# Patient Record
Sex: Male | Born: 1950 | Race: White | Hispanic: No | Marital: Married | Smoking: Never smoker
Health system: Southern US, Community
[De-identification: ages and names within clinical notes are randomized; demographics above are authoritative.]

## PROBLEM LIST (undated history)

## (undated) DIAGNOSIS — I10 Essential (primary) hypertension: Secondary | ICD-10-CM

---

## 1997-07-24 ENCOUNTER — Ambulatory Visit (HOSPITAL_COMMUNITY): Admission: RE | Admit: 1997-07-24 | Discharge: 1997-07-24 | Payer: Self-pay | Admitting: Gastroenterology

## 1999-05-12 ENCOUNTER — Ambulatory Visit (HOSPITAL_COMMUNITY): Admission: RE | Admit: 1999-05-12 | Discharge: 1999-05-12 | Payer: Self-pay | Admitting: Gastroenterology

## 1999-05-26 ENCOUNTER — Encounter: Payer: Self-pay | Admitting: Gastroenterology

## 1999-05-26 ENCOUNTER — Ambulatory Visit (HOSPITAL_COMMUNITY): Admission: RE | Admit: 1999-05-26 | Discharge: 1999-05-26 | Payer: Self-pay | Admitting: Gastroenterology

## 2003-12-26 ENCOUNTER — Ambulatory Visit: Payer: Self-pay | Admitting: Family Medicine

## 2004-02-26 ENCOUNTER — Encounter: Admission: RE | Admit: 2004-02-26 | Discharge: 2004-02-26 | Payer: Self-pay | Admitting: Occupational Medicine

## 2009-06-24 ENCOUNTER — Ambulatory Visit: Payer: Self-pay | Admitting: Emergency Medicine

## 2009-06-24 DIAGNOSIS — H6120 Impacted cerumen, unspecified ear: Secondary | ICD-10-CM

## 2010-02-04 NOTE — Assessment & Plan Note (Signed)
Summary: EAR WAX FLUSHED PROBLEMS WITH HEARING BOTH EARS   Vital Signs:  Patient Profile:   60 Years Old Male CC:      Cerumen impaction Height:     73 inches Weight:      175 pounds O2 Sat:      98 % O2 treatment:    Room Air Temp:     98.7 degrees F oral Pulse rate:   69 / minute Pulse rhythm:   regular Resp:     12 per minute BP sitting:   146 / 88  (right arm) Cuff size:   large  Vitals Entered By: Emilio Math (June 24, 2009 2:59 PM)                  Current Allergies: No known allergies History of Present Illness Chief Complaint: Cerumen impaction History of Present Illness: Went to get fitted for hearing aids and told he had too much wax.  He is here for irrigation both ears.  Some hearing loss, no ringing, no dizziness, no fever, no HA. Constant.  No OTC's help.  REVIEW OF SYSTEMS Constitutional Symptoms      Denies fever, chills, night sweats, weight loss, weight gain, and fatigue.  Eyes       Denies change in vision, eye pain, eye discharge, glasses, contact lenses, and eye surgery. Ear/Nose/Throat/Mouth       Complains of change in hearing.      Denies hearing loss/aids, ear pain, ear discharge, dizziness, frequent runny nose, frequent nose bleeds, sinus problems, sore throat, hoarseness, and tooth pain or bleeding.  Respiratory       Denies dry cough, productive cough, wheezing, shortness of breath, asthma, bronchitis, and emphysema/COPD.  Cardiovascular       Denies murmurs, chest pain, and tires easily with exhertion.    Gastrointestinal       Denies stomach pain, nausea/vomiting, diarrhea, constipation, blood in bowel movements, and indigestion. Genitourniary       Denies painful urination, kidney stones, and loss of urinary control. Neurological       Denies paralysis, seizures, and fainting/blackouts. Musculoskeletal       Denies muscle pain, joint pain, joint stiffness, decreased range of motion, redness, swelling, muscle weakness, and gout.   Skin       Denies bruising, unusual mles/lumps or sores, and hair/skin or nail changes.  Psych       Denies mood changes, temper/anger issues, anxiety/stress, speech problems, depression, and sleep problems.  Past History:  Past Medical History: Unremarkable  Past Surgical History: Denies surgical history  Family History: Mother, Healthy Father, D, Old age  Social History: Non smoker ETOH-yes No Drugs [place] Physical Exam General appearance: well developed, well nourished, no acute distress Head: normocephalic, atraumatic Ears: bilateral impacted cerumen.  After flushing, ears mostly clear.  Some canal erythema and swelling bilateral. but no infection. Nasal: mucosa pink, nonedematous, no septal deviation, turbinates normal Oral/Pharynx: tongue normal, posterior pharynx without erythema or exudate Chest/Lungs: no rales, wheezes, or rhonchi bilateral, breath sounds equal without effort Heart: regular rate and  rhythm, no murmur Assessment New Problems: CERUMEN IMPACTION, BILATERAL (ICD-380.4)   Plan New Medications/Changes: CIPRODEX 0.3-0.1 % SUSP (CIPROFLOXACIN-DEXAMETHASONE) 4 drops both ears two times a day for 7 days  #1 bottle x 0, 06/24/2009, Hoyt Koch MD  New Orders: New Patient Level III [phone] Cerumen Impaction Removal [69210]  The patient and/or caregiver has been counseled thoroughly with regard to medications prescribed including dosage, schedule, interactions, rationale  for use, and possible side effects and they verbalize understanding.  Diagnoses and expected course of recovery discussed and will return if not improved as expected or if the condition worsens. Patient and/or caregiver verbalized understanding.  Prescriptions: CIPRODEX 0.3-0.1 % SUSP (CIPROFLOXACIN-DEXAMETHASONE) 4 drops both ears two times a day for 7 days  #1 bottle x 0   Entered and Authorized by:   Hoyt Koch MD   Signed by:   Hoyt Koch MD on 06/24/2009    Method used:   Print then Give to Patient   RxID:   [id number]   Patient Instructions: 1)  Keep ears clean and dry. 2)  Return to audiologist to be retested to see if you still need hearing aids now that the wax has been removed 3)  Use eardrops for any pain/irritation  Orders Added: 1)  New Patient Level III [14782] 2)  Cerumen Impaction Removal [95621]

## 2010-05-23 NOTE — Op Note (Signed)
[facility]  Patient:    Alexander Butler, Alexander Butler                      MRN: [id number] Adm. Date:  [id number] Disc. Date: [id number] Attending:  Deneen Harts CC:         Leroy Sea., M.D.                           Operative Report  PROCEDURE:  Panendoscopy; foreign body removal.  ENDOSCOPIST:  Griffith Citron, M.D.  INDICATION:  [age]-year-old white male presenting with acute meat impaction since 7 p.m. last night.  Prior history of dysphagia with endoscopy revealing distal esophageal rings. These were dilated, July 1999, to 52-French using [facility] dilators.  Patient has not been maintained on any acid-suppressing medication.  Undergoing endoscopy to relieve meat impaction and to reassess distal esophageal narrowing.  DESCRIPTION OF PROCEDURE:  After reviewing the nature of the procedure with the patient including the potential risks of hemorrhage and especially perforation, informed consent was signed.  Patient was premedicated, receiving IV sedation totalling Versed 10 mg and Fentanyl 75 mcg, administered IV in divided doses prior to and during the course of the procedure.  Using an Olympus videoendoscope, proximal esophagus intubated under direct vision; the oropharynx normal.  No lesion of the epiglottis, vocal cords or piriform sinus.  Upper esophageal sphincter easily traversed.  Proximal and midesophagus normal except for some retained secretions, which were aspirated with the scope.  A large meat bolus was located in the distal esophagus.  This was grasped with the endoscopic snare and retracted along with the scope.  Following this, the patient was reintubated with the endoscope.  In the distal esophagus at approximately 36 cm, patient has a concentric distal esophageal ring with narrowing.  The mucosa was ulcerated and inflamed but appears benign.  The bottom 3 to 4 cm of the esophagus appeared normal with a widely patent GE  junction.  Mucosal Z-line is distinct.  No evidence of neoplasia.  Gastric fundus, body and antrum were normal except for scattered coffee grounds, without mucosal inflammation being noted.  Pylorus symmetric. Duodenal bulb and second portion normal.  Retroflexed view of the angularis, lesser curve, gastric cardia and fundus negative.  Stomach was decompressed; scope withdrawn.  Patient tolerated the procedure without difficulty, being maintained on digiscope monitor and low-flow oxygen throughout.  Returned to recovery in stable condition.  ASSESSMENT: 1. Meat impaction -- successfully relieved endoscopically. 2. 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  RECOMMENDATION: 1. Nexium 40 mg daily. 2. Endoscopy with Savary dilation in one to two weeks. DD:  05/12/99 TD:  05/14/99 Job: [id number]

## 2010-05-23 NOTE — Procedures (Signed)
[facility]  Patient:    Alexander Butler, Alexander Butler                      MRN: [id number] Proc. Date: 05/26/99 Adm. Date:  [id number] Disc. Date: [id number] Attending:  Deneen Harts CC:         Leroy Sea., M.D.                           Procedure Report  PROCEDURE PERFORMED:  Panendoscopy with Savary dilation.  ENDOSCOPIST:  Griffith Citron, M.D.  INDICATIONS FOR PROCEDURE:  A 60 year old white male presenting two weeks ago with food impaction undergoing endoscopy following disimpaction and resolution of acute esophagitis related to that event.  The patient has remained asymptomatic during the interim.  He could not tolerate Nexium due to dizziness.  Denies pyrosis, ____________ .  DESCRIPTION OF PROCEDURE:  After reviewing the nature of the procedure with the patient including potential risks and complications, and after discussing alternative methods of diagnosis and treatment, informed consent was signed.  The patient was premedicated receiving topical anesthetic followed by IV sedation totaling Versed 10 mg, fentanyl 100 mcg administered in divided doses prior to and during the course of the procedure.  Endoscopy using the Olympus video endoscope was successfully completed.  The oropharynx was normal.  No lesion of epiglottis, vocal cords or piriform sinus.  The proximal esophagus was easily intubated and the scope advanced revealing a normal proximal, mid and distal segment.  At the esophagogastric junction a striction was noted at approximately 40 cm.  The mucosa was not inflammed.  No nodularity, erosion, ulceration.  No erythema, no edema.  A large hiatal hernia from 40 to 45 cm detected, not inflamed.  The gastric fundus and body were normal, antrum remarkable for erythematous mucosa, no erosion or ulceration.  Slight coffee ground exudate adherent to the wall.  Pylorus was symmetric, easily traversed.  The duodenal bulb and second  portion were both normal.  Retroflex view of the angularis, lesser curve, gastric cardia and fundus revealed no additional finding.  Under endoscopic control, a Savary guide wire was laid along the greater curve.  Position was maintained with the fluoroscopy while the scope was removed.  Over the orad portion of the guide wire 15 then 16 mm diameter Savary dilators were passed under fluoroscopic guidance.  No resistance appreciated with a 15 mm diameter, no heme staining.  With passage of a 16 mm diameter dilator, there was minimal resistance but obvious heme staining upon withdrawal of the dilator.  Guide wire was subsequently removed.  Re-endoscopy was performed because of the heme and this revealed a large mucosal rent in the distal esophagus at the level of the stricture.  No other abnormality was noted.  The esophagus was decompressed and scope withdrawn.  The patient tolerated the procedure without difficulty being maintained on Datascope monitor and low-flow oxygen throughout.  Returned to recovery in stable condition.  ASSESSMENT: 1. Distal esophagogastric junction stricture. 2. Savary dilation to 16 mm.  Terminated at this point due to mucosal rent    with heme.  Large hiatal hernia not inflamed.  Antritis--mild.  RECOMMENDATIONS: 1. Gastrografin barium swallow prior to discharge. 2. Retry Nexium 40 mg daily.  If not tolerated, switch to alternative PPI    therapy. 3. Repeat esophageal dilation p.r.n. DD:  05/26/99 TD:  05/30/99 Job: [id number]

## 2013-08-31 ENCOUNTER — Emergency Department (INDEPENDENT_AMBULATORY_CARE_PROVIDER_SITE_OTHER): Payer: BC Managed Care – PPO

## 2013-08-31 ENCOUNTER — Emergency Department
Admission: EM | Admit: 2013-08-31 | Discharge: 2013-08-31 | Disposition: A | Discharge status: Home / Self Care | Payer: BC Managed Care – PPO | Source: Home / Self Care | Attending: Emergency Medicine | Admitting: Emergency Medicine

## 2013-08-31 ENCOUNTER — Encounter: Payer: Self-pay | Admitting: Emergency Medicine

## 2013-08-31 DIAGNOSIS — X58XXXA Exposure to other specified factors, initial encounter: Secondary | ICD-10-CM

## 2013-08-31 DIAGNOSIS — S62309A Unspecified fracture of unspecified metacarpal bone, initial encounter for closed fracture: Secondary | ICD-10-CM

## 2013-08-31 DIAGNOSIS — S62351A Nondisplaced fracture of shaft of second metacarpal bone, left hand, initial encounter for closed fracture: Secondary | ICD-10-CM

## 2013-08-31 DIAGNOSIS — S62329A Displaced fracture of shaft of unspecified metacarpal bone, initial encounter for closed fracture: Secondary | ICD-10-CM

## 2013-08-31 HISTORY — DX: Essential (primary) hypertension: I10

## 2013-08-31 MED ORDER — HYDROCODONE-ACETAMINOPHEN 5-325 MG PO TABS: 1.0000 | ORAL_TABLET | ORAL | Status: DC | PRN

## 2013-08-31 NOTE — Discharge Instructions (Signed)
Splint Care Casts and splints support injured limbs and keep bones from moving while they heal.  HOME CARE  Keep the cast or splint uncovered during the drying period.  .  A fiberglass cast will dry in less than 1 hour.  Do not rest the cast on anything harder than a pillow for 24 hours.  Do not put weight on your injured limb. Do not put pressure on the cast. Wait for your doctor's approval.  Keep the cast or splint dry.  Cover the cast or splint with a plastic bag during baths or wet weather.  If you have a cast over your chest and belly (trunk), take sponge baths until the cast is taken off.  If your cast gets wet, dry it with a towel or blow dryer. Use the cool setting on the blow dryer.  Keep your cast or splint clean. Wash a dirty cast with a damp cloth.  Do not put any objects under your cast or splint.  Do not scratch the skin under the cast with an object. If itching is a problem, use a blow dryer on a cool setting over the itchy area.  Do not trim or cut your cast.  Do not take out the padding from inside your cast.  Exercise your joints near the cast as told by your doctor.  Raise (elevate) your injured limb on 1 or 2 pillows for the first 1 to 3 days. GET HELP IF:  Your cast or splint cracks.  Your cast or splint is too tight or too loose.  You itch badly under the cast.  Your cast gets wet or has a soft spot.  You have a bad smell coming from the cast.  You get an object stuck under the cast.  Your skin around the cast becomes red or sore.  You have new or more pain after the cast is put on. GET HELP RIGHT AWAY IF:  You have fluid leaking through the cast.  You cannot move your fingers or toes.  Your fingers or toes turn blue or white or are cool, painful, or puffy (swollen).  You have tingling or lose feeling (numbness) around the injured area.  You have bad pain or pressure under the cast.  You have trouble breathing or have shortness of  breath.  You have chest pain.  Followup appointment with Dr. Benjamin Stain, sports medicine specialist on Monday 8/31 930 am

## 2013-08-31 NOTE — ED Provider Notes (Addendum)
CSN: [id number]     Arrival date & time 08/31/13  1451 History   First MD Initiated Contact with Patient 08/31/13 1455     Chief Complaint  Patient presents with  . Hand Injury  Left hand injury yesterday, while loading an old satellite dish dropped it on his hand. Progressively swollen and painful. Patient is a 63 y.o. male presenting with hand injury. The history is provided by the patient.  Hand Injury Time since incident:  1 day Injury: yes   Pain details:    Quality:  Aching   Radiates to:  Does not radiate   Pain severity now: 6/10.   Progression:  Worsening Chronicity:  New Handedness:  Right-handed Relieved by:  Rest Exacerbated by: Activity such as gripping. He did yard work after the injury and that exacerbated the right hand pain. Associated symptoms: decreased range of motion and swelling   Associated symptoms: no back pain, no fever, no muscle weakness, no neck pain, no numbness and no tingling   Risk factors: no recent illness      Past Medical History  Diagnosis Date  . Hypertension    History reviewed. No pertinent past surgical history. No family history on file. History  Substance Use Topics  . Smoking status: Never Smoker   . Smokeless tobacco: Not on file  . Alcohol Use: Yes    Review of Systems  Constitutional: Negative for fever.  Musculoskeletal: Negative for back pain and neck pain.    Allergies  Review of patient's allergies indicates no known allergies.  Home Medications   Prior to Admission medications   Medication Sig Start Date End Date Taking? Authorizing Provider  lisinopril (PRINIVIL,ZESTRIL) 20 MG tablet Take 20 mg by mouth daily.   Yes Historical Provider, MD  omeprazole (PRILOSEC) 20 MG capsule Take 20 mg by mouth daily.   Yes Historical Provider, MD  HYDROcodone-acetaminophen (NORCO/VICODIN) 5-325 MG per tablet Take 1-2 tablets by mouth every 4 (four) hours as needed for severe pain. Take with food. 08/31/13   Lajean Manes, MD    BP 127/82  Pulse 68  Temp(Src) 98.4 F (36.9 C) (Oral)  Ht  (1.854 m)  Wt 182 lb (82.555 kg)  BMI 24.02 kg/m2  SpO2 99% Physical Exam  Nursing note and vitals reviewed. Constitutional: He is oriented to person, place, and time. He appears well-developed and well-nourished. No distress.  HENT:  Head: Normocephalic and atraumatic.  Eyes: Conjunctivae and EOM are normal. Pupils are equal, round, and reactive to light. No scleral icterus.  Neck: Normal range of motion.  Cardiovascular: Normal rate.   Pulmonary/Chest: Effort normal.  Abdominal: He exhibits no distension.  Musculoskeletal:       Right wrist: Normal. He exhibits normal range of motion, no tenderness and no swelling.       Left wrist: Normal. He exhibits normal range of motion, no tenderness, no bony tenderness and no swelling.       Right hand: Normal. He exhibits normal range of motion, no bony tenderness, normal capillary refill and no swelling. Normal sensation noted. Normal strength noted.       Left hand: He exhibits decreased range of motion, bony tenderness and swelling (3+). He exhibits normal capillary refill and no laceration. Normal sensation noted. Normal strength noted.       Hands: Left hand: He has chronic thickening of tendons/Dupuytren's contracture of  fourth and fifth metacarpal area, palmar aspect. 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  Otherwise, tendons and function of left hand within normal limits  Neurological: He is alert and oriented to person, place, and time.  Skin: Skin is warm.  Psychiatric: He has a normal mood and affect.    Neurovascular distally intact of left hand ED Course  Procedures (including critical care time) Labs Review Labs Reviewed - No data to display  Imaging Review Dg Hand Complete Left  08/31/2013   CLINICAL DATA:  Contusion of the left hand. With swelling and erythema over the metacarpal region.  EXAM: LEFT HAND - COMPLETE 3+ VIEW   COMPARISON:  Left hand dated February 26, 2004  FINDINGS: The bones are adequately mineralized. There is a nondisplaced fracture through the head of the second metacarpal. There is mild to moderate degenerative change of the second and third metacarpophalangeal joints which has progressed since the previous study. The interphalangeal joints exhibit no significant degenerative change. There is mild degenerative change of the first carpometacarpal joint.  IMPRESSION: 1. There is an acute nondisplaced fracture through the head of the second metacarpal. No fractures demonstrated elsewhere. 2. There are degenerative changes of the second and third metacarpophalangeal joints and of the first carpometacarpal joint.   Electronically Signed   By: David  [place]   On: 08/31/2013 15:31     MDM   1. Closed nondisplaced fracture of shaft of second metacarpal bone of left hand, initial encounter     I consulted Dr. Benjamin Stain (sports medicine specialist) who reviewed x-rays with me. He who advised Fiberglas splinting, and followup with Dr. Benjamin Stain in 4-5 days.  Treatment options discussed with patient, as well as risks, benefits, alternatives. Patient voiced understanding and agreement with the following plans:  I applied Left Fiberglass Volar splint , from proximal elbow to distal fingers, with thumb exposed. He tolerated well. Ibuprofen OTC 600 mg 3 times a day with food when necessary mild to moderate pain. Vicodin if needed for severe pain.-Precautions discussed Cast aftercare discussed.--An After Visit Summary was printed and given to the patient--this included detailed splint care and precautions . We made a followup appointment with Dr. Benjamin Stain for Monday 8/31 at 9:30 AM.  Precautions discussed. Red flags discussed.--- Emergency room if any red flags. Questions invited and answered. Patient voiced understanding and agreement.    Lajean Manes, MD 08/31/13 1634  Addendum: I wrote a  work note stating that he has a fracture of left hand. No work from 8/27 through 09/04/13. After 09/04/13, work status to be determined by the specialist.  Lajean Manes, MD 08/31/13 [phone]

## 2013-08-31 NOTE — ED Notes (Signed)
Left hand injury yesterday, while loading an old satellite dish dropped it on his hand. 6/10, red, swollen

## 2013-09-04 ENCOUNTER — Ambulatory Visit (INDEPENDENT_AMBULATORY_CARE_PROVIDER_SITE_OTHER): Payer: BC Managed Care – PPO

## 2013-09-04 ENCOUNTER — Encounter: Payer: Self-pay | Admitting: Sports Medicine

## 2013-09-04 ENCOUNTER — Ambulatory Visit (INDEPENDENT_AMBULATORY_CARE_PROVIDER_SITE_OTHER): Payer: BC Managed Care – PPO | Admitting: Sports Medicine

## 2013-09-04 VITALS — BP 143/89 | HR 72 | Ht 73.0 in | Wt 182.0 lb

## 2013-09-04 DIAGNOSIS — IMO0001 Reserved for inherently not codable concepts without codable children: Secondary | ICD-10-CM

## 2013-09-04 DIAGNOSIS — S62301A Unspecified fracture of second metacarpal bone, left hand, initial encounter for closed fracture: Secondary | ICD-10-CM | POA: Insufficient documentation

## 2013-09-04 DIAGNOSIS — S62309A Unspecified fracture of unspecified metacarpal bone, initial encounter for closed fracture: Secondary | ICD-10-CM

## 2013-09-04 NOTE — Progress Notes (Signed)
   Subjective:    I'm seeing this patient as a consultation for:  Dr. Georgina Pillion  CC: Left hand fracture  HPI: This is a very pleasant 63 year old male, 4 days ago he had a large satellite dish falling his head, he had immediate pain and swelling, x-rays at her daycare show a transverse fracture through the neck of the second metacarpal. He was placed in a volar splint and referred to me for further evaluation and definitive treatment. Pain control is adequate. Mild, persistent.  Past medical history, Surgical history, Family history not pertinant except as noted below, Social history, Allergies, and medications have been entered into the medical record, reviewed, and no changes needed.   Review of Systems: No headache, visual changes, nausea, vomiting, diarrhea, constipation, dizziness, abdominal pain, skin rash, fevers, chills, night sweats, weight loss, swollen lymph nodes, body aches, joint swelling, muscle aches, chest pain, shortness of breath, mood changes, visual or auditory hallucinations.   Objective:   General: Well Developed, well nourished, and in no acute distress.  Neuro/Psych: Alert and oriented x3, extra-ocular muscles intact, able to move all 4 extremities, sensation grossly intact. Skin: Warm and dry, no rashes noted.  Respiratory: Not using accessory muscles, speaking in full sentences, trachea midline.  Cardiovascular: Pulses palpable, no extremity edema. Abdomen: Does not appear distended. Left hand :Swollen, bruised, tender to palpation over the neck of the second metacarpal.  X-rays do show a transverse fracture, nondisplaced non-angulated through the neck of the second metacarpal.  Radial gutter splint placed.  Impression and Recommendations:   This case required medical decision making of moderate complexity.

## 2013-09-04 NOTE — Assessment & Plan Note (Signed)
4 days post fracture, it is through the neck of the second metatarsal. Nondisplaced, non-angulated. This will respond well to radial gutter splint immobilization until swelling is resolved and then I will place a radial gutter cast. X-rays today, splint placed. Return in one week, x-rays before visit.  I billed a fracture code for this encounter, all subsequent visits will be post-op checks in the global period.

## 2013-09-18 ENCOUNTER — Encounter: Payer: Self-pay | Admitting: Sports Medicine

## 2013-09-18 ENCOUNTER — Ambulatory Visit (INDEPENDENT_AMBULATORY_CARE_PROVIDER_SITE_OTHER): Payer: BC Managed Care – PPO

## 2013-09-18 ENCOUNTER — Ambulatory Visit (INDEPENDENT_AMBULATORY_CARE_PROVIDER_SITE_OTHER): Payer: BC Managed Care – PPO | Admitting: Sports Medicine

## 2013-09-18 VITALS — BP 144/86 | HR 66 | Ht 72.0 in | Wt 181.0 lb

## 2013-09-18 DIAGNOSIS — IMO0001 Reserved for inherently not codable concepts without codable children: Secondary | ICD-10-CM

## 2013-09-18 DIAGNOSIS — S62301D Unspecified fracture of second metacarpal bone, left hand, subsequent encounter for fracture with routine healing: Secondary | ICD-10-CM

## 2013-09-18 DIAGNOSIS — S62309D Unspecified fracture of unspecified metacarpal bone, subsequent encounter for fracture with routine healing: Secondary | ICD-10-CM

## 2013-09-18 DIAGNOSIS — S5290XD Unspecified fracture of unspecified forearm, subsequent encounter for closed fracture with routine healing: Secondary | ICD-10-CM

## 2013-09-18 NOTE — Progress Notes (Signed)
  Subjective: 2-1/2 weeks post fracture of the left second metacarpal neck, pain free so far in a radial gutter splint.   Objective: General: Well-developed, well-nourished, and in no acute distress.  Left hand: Splint is removed, no tenderness over the fracture site, good motion, neurovascularly intact distally. I reapplied the radial gutter splint.  X-rays reviewed and show good bony callus around the second carpal neck fracture.  Assessment/plan:

## 2013-09-18 NOTE — Assessment & Plan Note (Addendum)
Doing well, we will continue the radial gutter splint rather than place a cast per patient request. Return in 3 weeks, afterwards we will probably get him out of the splint and work on range of motion. 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

## 2013-10-09 ENCOUNTER — Ambulatory Visit (INDEPENDENT_AMBULATORY_CARE_PROVIDER_SITE_OTHER): Payer: BC Managed Care – PPO

## 2013-10-09 ENCOUNTER — Ambulatory Visit (INDEPENDENT_AMBULATORY_CARE_PROVIDER_SITE_OTHER): Payer: BC Managed Care – PPO | Admitting: Sports Medicine

## 2013-10-09 ENCOUNTER — Encounter: Payer: Self-pay | Admitting: Sports Medicine

## 2013-10-09 VITALS — BP 131/80 | HR 77 | Ht 73.0 in | Wt 184.0 lb

## 2013-10-09 DIAGNOSIS — S62301D Unspecified fracture of second metacarpal bone, left hand, subsequent encounter for fracture with routine healing: Secondary | ICD-10-CM

## 2013-10-09 DIAGNOSIS — X58XXXD Exposure to other specified factors, subsequent encounter: Secondary | ICD-10-CM

## 2013-10-09 DIAGNOSIS — S62331D Displaced fracture of neck of second metacarpal bone, left hand, subsequent encounter for fracture with routine healing: Secondary | ICD-10-CM

## 2013-10-09 MED ORDER — HYDROCODONE-ACETAMINOPHEN 5-325 MG PO TABS: 1.0000 | ORAL_TABLET | ORAL | Status: DC | PRN

## 2013-10-09 NOTE — Progress Notes (Signed)
  Subjective: 5.5 weeks post transverse fracture at the neck of the left second metacarpal, overall doing very well.  Objective: General: Well-developed, well-nourished, and in no acute distress. Left hand: only minimal tenderness at the fracture site, he is able to attain flexion to approximately 25 at the metacarpal phalangeal joint. Neurovascularly intact distally.  X-rays show stability of the fracture with good bony callus.  Assessment/plan:

## 2013-10-09 NOTE — Addendum Note (Signed)
Addended by: Monica BectonHEKKEKANDAM, Arnaldo Heffron J on: 10/09/2013 03:48 PM   Modules accepted: Orders

## 2013-10-09 NOTE — Assessment & Plan Note (Signed)
I could not be more pleased with how good Alexander Butler is doing at this point. We're going to start formal physical therapy to work on strength and range of motion. Return to see me in one month.

## 2013-10-16 ENCOUNTER — Ambulatory Visit (INDEPENDENT_AMBULATORY_CARE_PROVIDER_SITE_OTHER): Payer: BC Managed Care – PPO | Admitting: Physical Therapy

## 2013-10-16 DIAGNOSIS — M2560 Stiffness of unspecified joint, not elsewhere classified: Secondary | ICD-10-CM

## 2013-10-23 ENCOUNTER — Encounter: Payer: BC Managed Care – PPO | Admitting: Physical Therapy

## 2013-10-26 ENCOUNTER — Encounter: Payer: BC Managed Care – PPO | Admitting: Physical Therapy

## 2013-10-30 ENCOUNTER — Encounter: Payer: BC Managed Care – PPO | Admitting: Physical Therapy

## 2013-11-02 ENCOUNTER — Encounter: Payer: BC Managed Care – PPO | Admitting: Physical Therapy

## 2013-11-06 ENCOUNTER — Ambulatory Visit (INDEPENDENT_AMBULATORY_CARE_PROVIDER_SITE_OTHER): Payer: BC Managed Care – PPO | Admitting: Sports Medicine

## 2013-11-06 ENCOUNTER — Encounter: Payer: Self-pay | Admitting: Sports Medicine

## 2013-11-06 VITALS — BP 146/85 | HR 68 | Wt 184.0 lb

## 2013-11-06 DIAGNOSIS — S62301D Unspecified fracture of second metacarpal bone, left hand, subsequent encounter for fracture with routine healing: Secondary | ICD-10-CM

## 2013-11-06 NOTE — Assessment & Plan Note (Signed)
Excellent motion, no pain. 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

## 2013-11-06 NOTE — Progress Notes (Signed)
  Subjective: 9 weeks post fracture at the neck of the left second metacarpal, doing well, physical therapy has helped with his range of motion, he is essentially back to normal. He also has some pain, arthritis, and lack of range of motion at the right second metacarpal phalangeal joint, his hand is functional however he is amenable to interventional treatment at a future visit.   Objective: General: Well-developed, well-nourished, and in no acute distress. Left hand: Flexion to 90 at the secondmetacarpal phalangeal joint, no longer tender to palpation.  Assessment/plan:

## 2015-06-10 IMAGING — CR DG HAND COMPLETE 3+V*L*
3 series · 3 of 3 positions shown · non-contrast
Comparison: 08/31/2013

CLINICAL DATA: Follow-up fracture

EXAM:
LEFT HAND - COMPLETE 3+ VIEW

[view not recorded (1 of 3)]
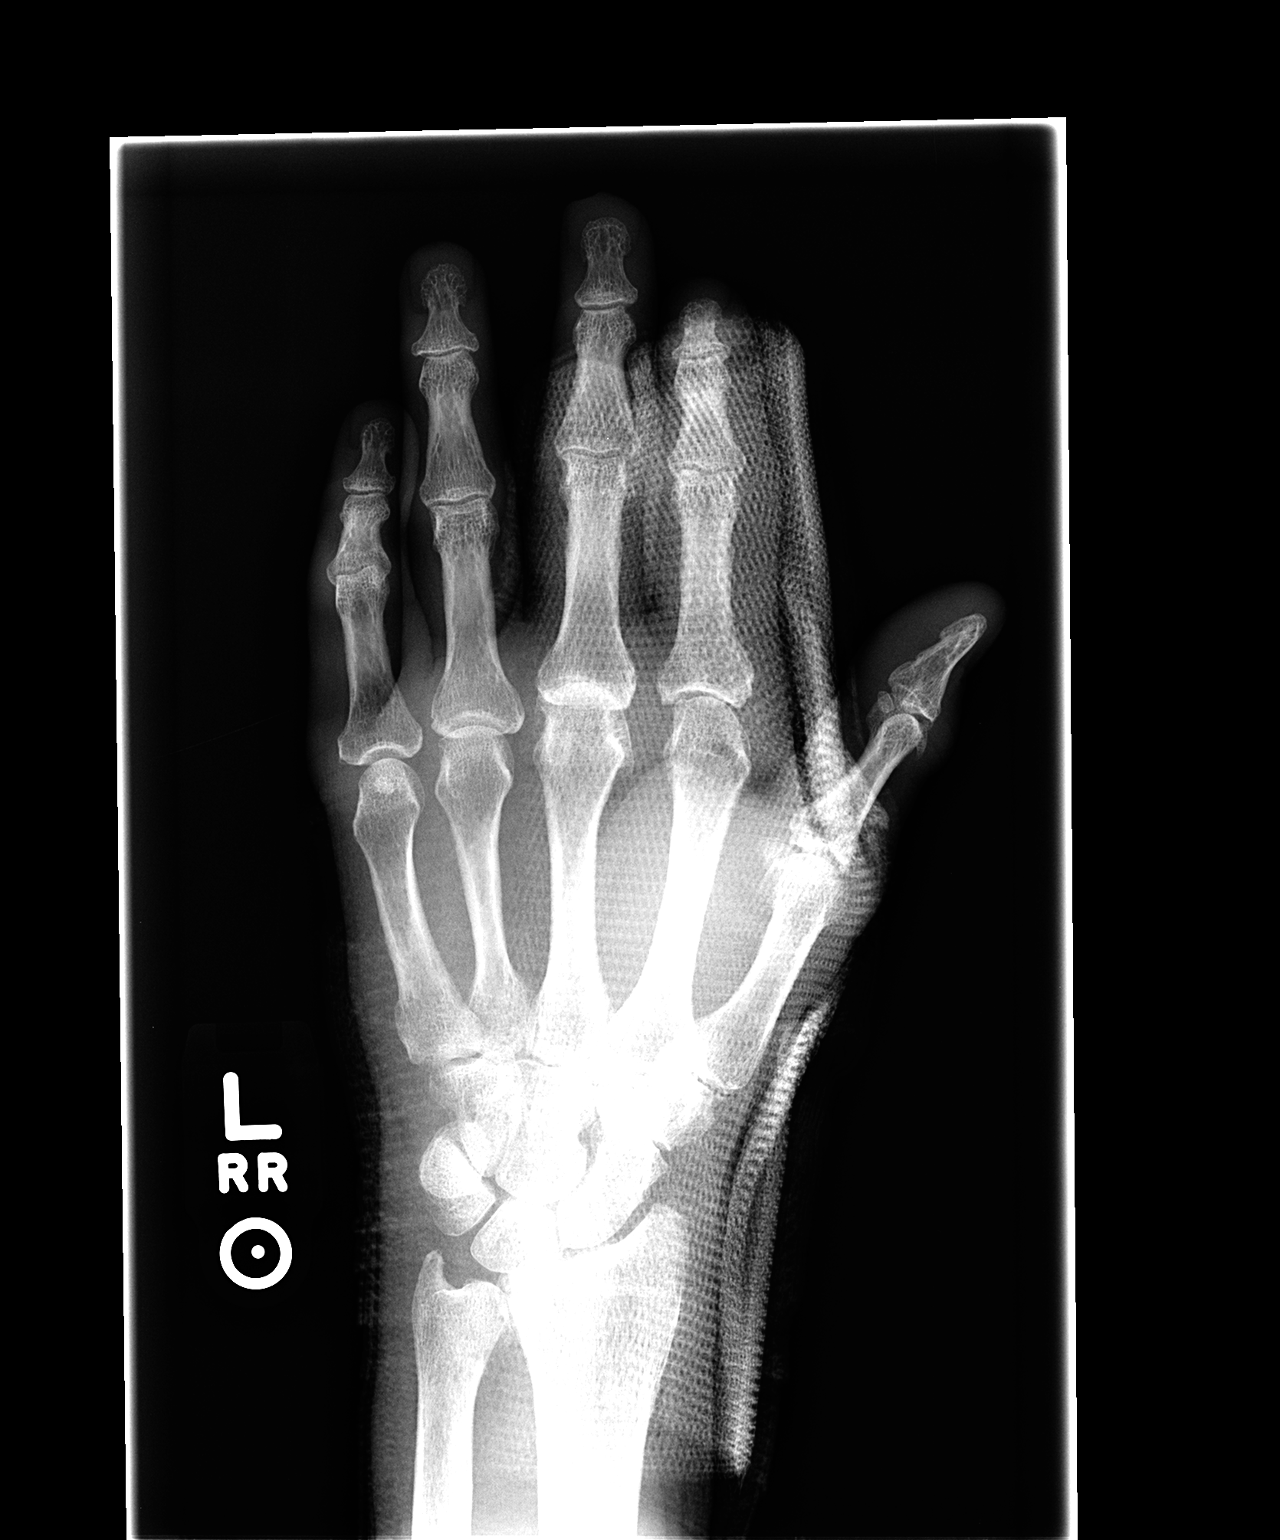

[view not recorded (2 of 3)]
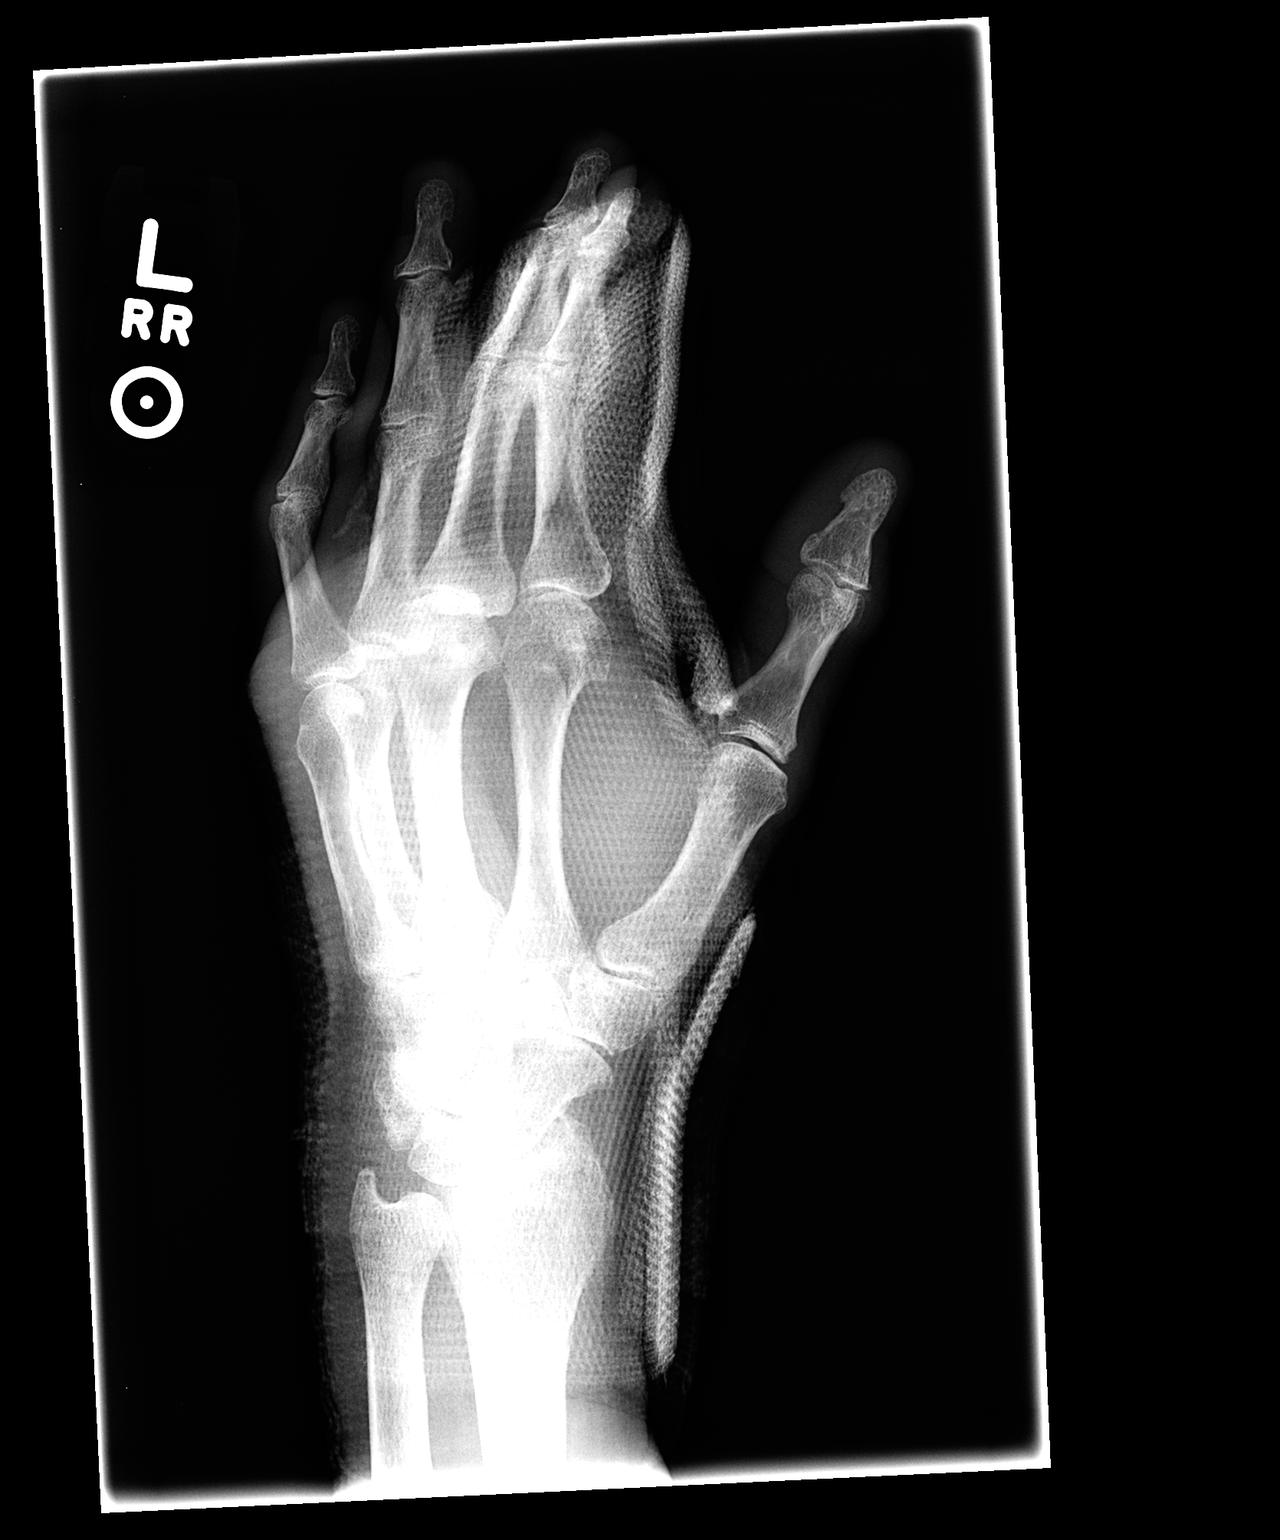

[view not recorded (3 of 3)]
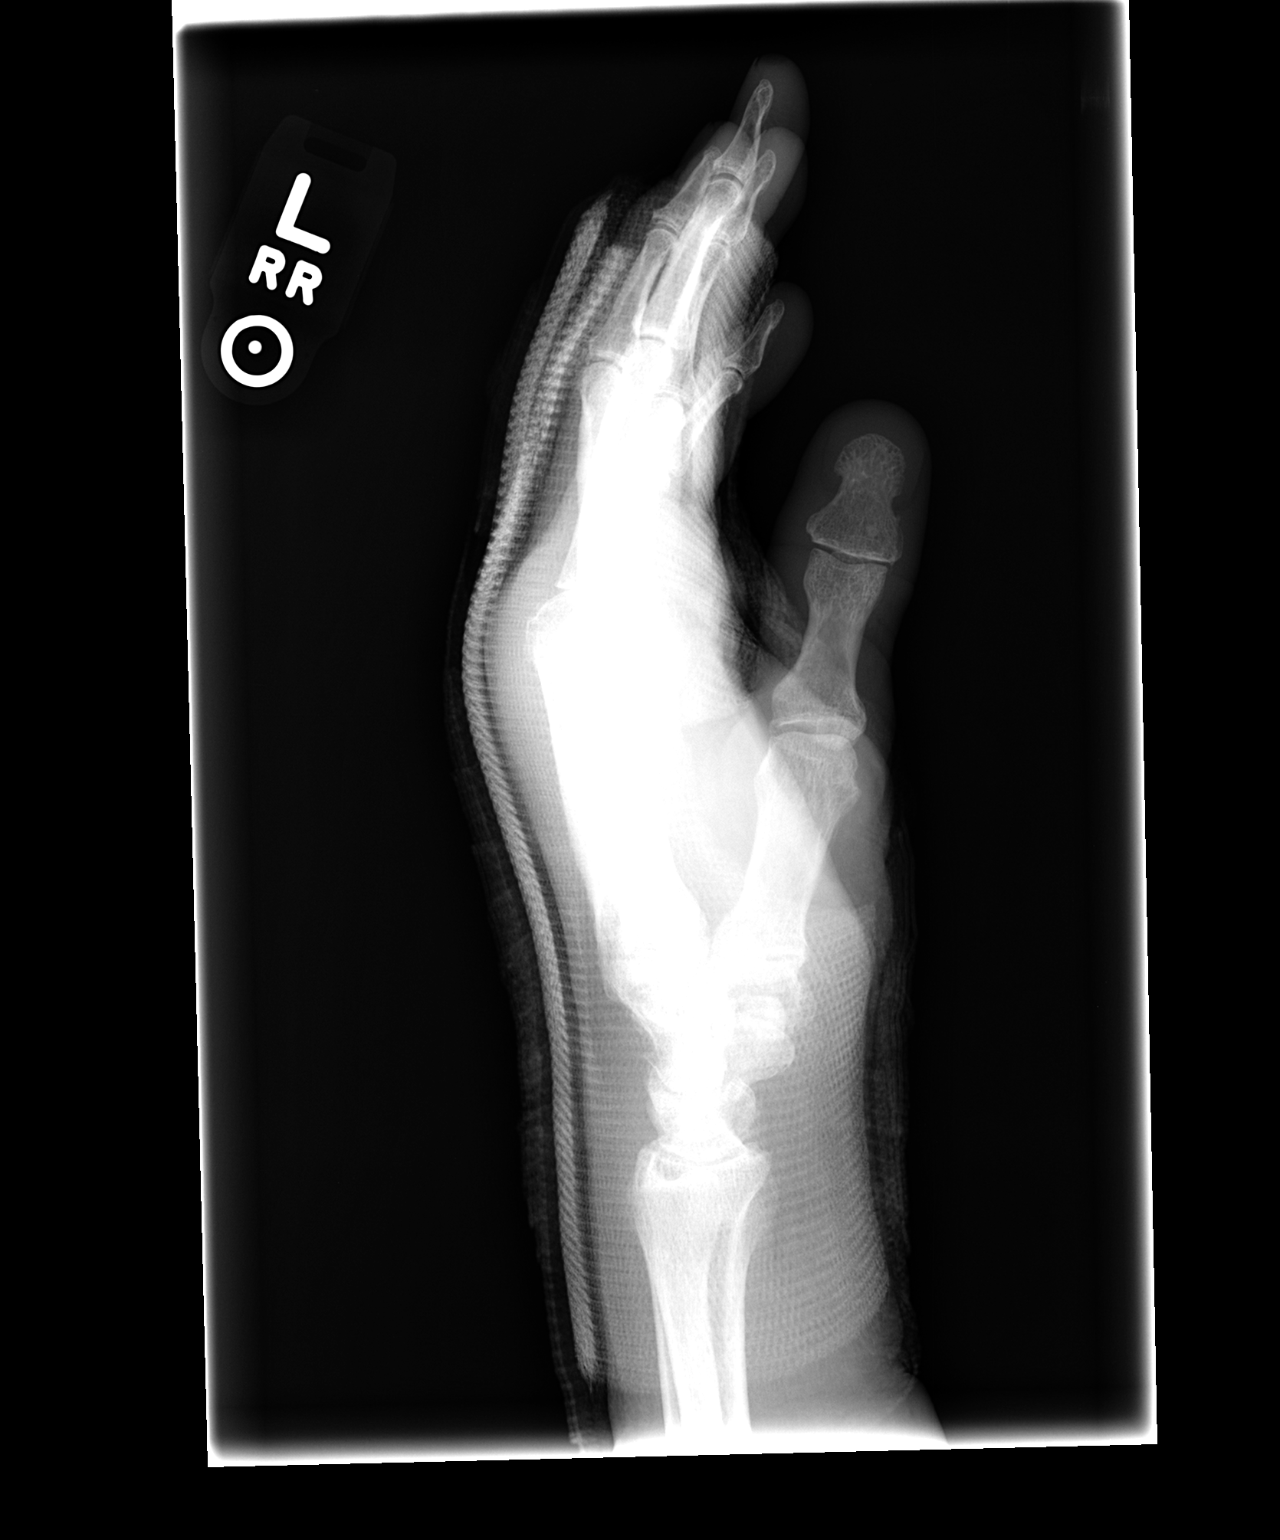

[3 of 3 positions shown; findings below may reference images not displayed]

FINDINGS: Three views of the left hand submitted. Again noted nondisplaced
fracture distal aspect of second metacarpal with alignment
preserved. Study is limited by casting material artifact.
Degenerative changes first carpometacarpal joint.
IMPRESSION: Again noted nondisplaced fracture distal aspect second metacarpal
with alignment preserved.

## 2015-07-15 IMAGING — CR DG HAND COMPLETE 3+V*L*
3 series · 3 of 3 positions shown · non-contrast
Comparison: [DATE] [DATE], [DATE] [DATE] [DATE], [DATE]

CLINICAL DATA: Close fracture of the second metacarpal. Recheck
fracture.

EXAM:
LEFT HAND - COMPLETE 3+ VIEW

[view not recorded (1 of 3)]
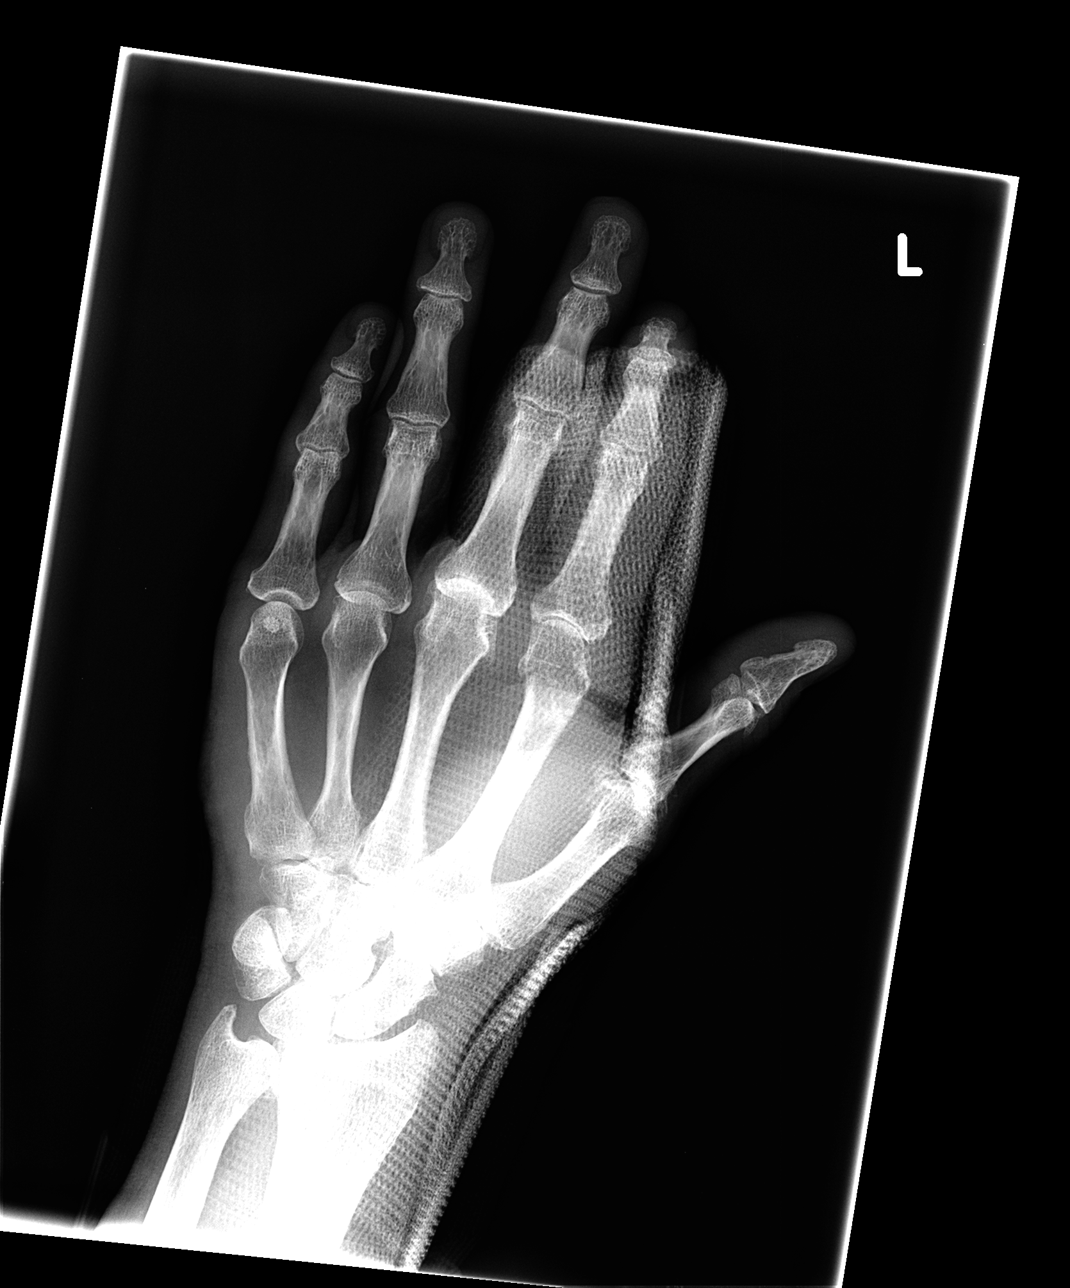

[view not recorded (2 of 3)]
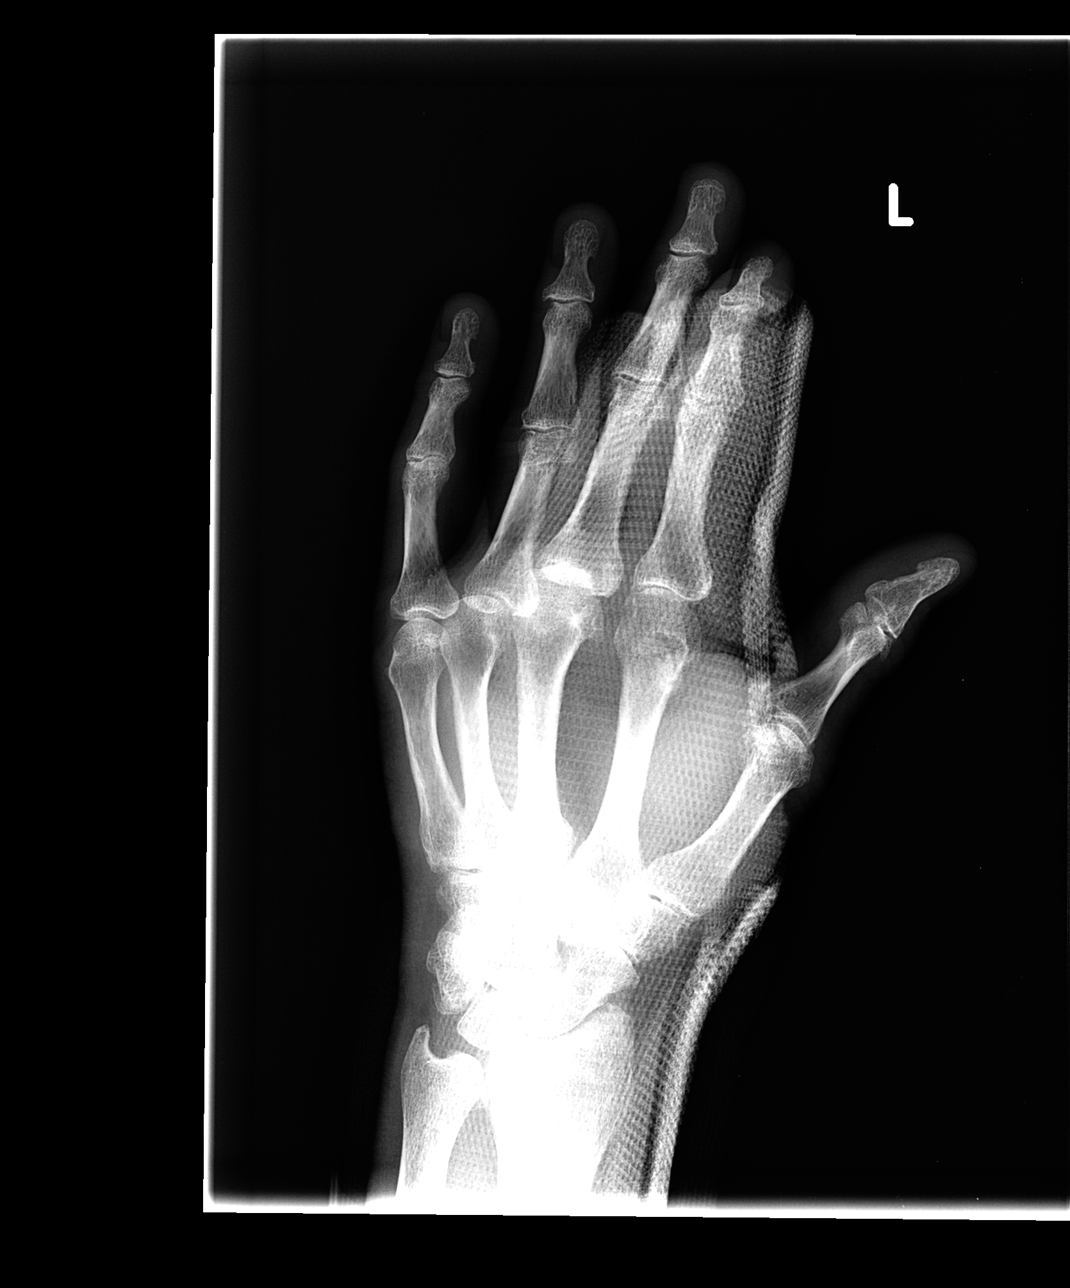

[view not recorded (3 of 3)]
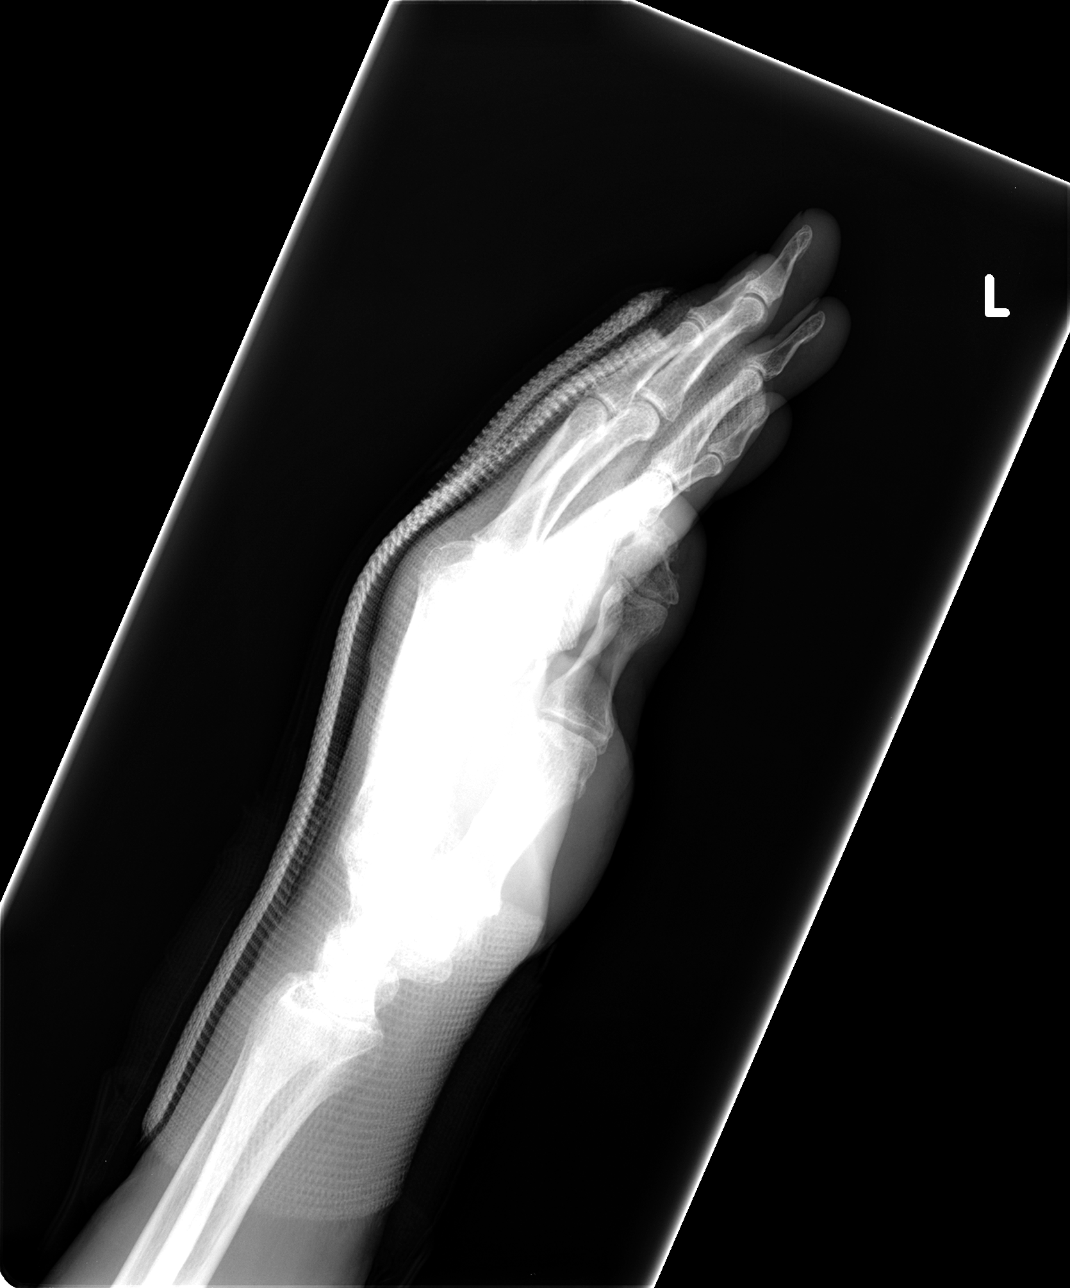

[3 of 3 positions shown; findings below may reference images not displayed]

FINDINGS: The soft tissues and osseous structures are obscured by her
overlying cast of the second and third digits. There is no gross
displacement of the distal second metacarpal. Fracture of the distal
second metacarpal is unchanged since September 18, 2013.
IMPRESSION: Soft tissue and osseous structures evaluation limited by overlying
cast. No significant change since September 18, 2013 of metacarpal.

## 2017-05-13 ENCOUNTER — Ambulatory Visit (INDEPENDENT_AMBULATORY_CARE_PROVIDER_SITE_OTHER): Payer: BLUE CROSS/BLUE SHIELD | Admitting: Sports Medicine

## 2017-05-13 DIAGNOSIS — M720 Palmar fascial fibromatosis [Dupuytren]: Secondary | ICD-10-CM

## 2017-05-13 NOTE — Progress Notes (Signed)
Subjective:    I'm seeing this patient as a consultation for: Dr. Elias Else  CC: Abnormal appearance of left hand  HPI: For years this pleasant 67 year old male has had a flexed appearance of his left fifth digit, this has been progressive over time, he is unable to place his hand flat on a table, is starting to have a bit of difficulty playing guitar.  Symptoms are moderate, persistent.  I reviewed the past medical history, family history, social history, surgical history, and allergies today and no changes were needed.  Please see the problem list section below in epic for further details.  Past Medical History: Past Medical History:  Diagnosis Date  . Hypertension    Past Surgical History: No past surgical history on file. Social History: Social History   Socioeconomic History  . Marital status: Married    Spouse name: Not on file  . Number of children: Not on file  . Years of education: Not on file  . Highest education level: Not on file  Occupational History  . Not on file  Social Needs  . Financial resource strain: Not on file  . Food insecurity:    Worry: Not on file    Inability: Not on file  . Transportation needs:    Medical: Not on file    Non-medical: Not on file  Tobacco Use  . Smoking status: Never Smoker  Substance and Sexual Activity  . Alcohol use: Yes  . Drug use: Not on file  . Sexual activity: Not on file  Lifestyle  . Physical activity:    Days per week: Not on file    Minutes per session: Not on file  . Stress: Not on file  Relationships  . Social connections:    Talks on phone: Not on file    Gets together: Not on file    Attends religious service: Not on file    Active member of club or organization: Not on file    Attends meetings of clubs or organizations: Not on file    Relationship status: Not on file  Other Topics Concern  . Not on file  Social History Narrative  . Not on file   Family History: No family history on  file. Allergies: No Known Allergies Medications: See med rec.  Review of Systems: No headache, visual changes, nausea, vomiting, diarrhea, constipation, dizziness, abdominal pain, skin rash, fevers, chills, night sweats, weight loss, swollen lymph nodes, body aches, joint swelling, muscle aches, chest pain, shortness of breath, mood changes, visual or auditory hallucinations.   Objective:   General: Well Developed, well nourished, and in no acute distress.  Neuro:  Extra-ocular muscles intact, able to move all 4 extremities, sensation grossly intact.  Deep tendon reflexes tested were normal. Psych: Alert and oriented, mood congruent with affect. ENT:  Ears and nose appear unremarkable.  Hearing grossly normal. Neck: Unremarkable overall appearance, trachea midline.  No visible thyroid enlargement. Eyes: Conjunctivae and lids appear unremarkable.  Pupils equal and round. Skin: Warm and dry, no rashes noted.  Cardiovascular: Pulses palpable, no extremity edema. Left hand: Visible Dupuytren's contracture, significant flexion deformity at the PIP of the fifth digit.  Neurovascularly intact distally.  Impression and Recommendations:   This case required medical decision making of moderate complexity.  Dupuytren's contracture of left hand Significant flexion deformity of the fifth digit, I think this does need a enzymatic injection versus removal in the operating room. Because I cannot perform this procedure I am going  to no charge this visit and I would like to refer him to Dr. Amanda Pea with hand surgery. ___________________________________________ Ihor Austin. Benjamin Stain, M.D., ABFM., CAQSM. Primary Care and Sports Medicine [facility]  Adjunct Instructor of Family Medicine  [employer]

## 2017-05-13 NOTE — Assessment & Plan Note (Signed)
Significant flexion deformity of the fifth digit, I think this does need a enzymatic injection versus removal in the operating room. Because I cannot perform this procedure I am going to no charge this visit and I would like to refer him to Dr. Amanda Pea with hand surgery.

## 2017-05-13 NOTE — Patient Instructions (Signed)
Dupuytren Contracture Dupuytren contracture is a condition in which tissue under the skin of the palm becomes abnormally thickened. This causes one or more of the fingers to curl inward (contract) toward the palm. Eventually, the fingers may not be able to straighten out. This condition affects some or all of the fingers and the palm of the hand. It is often passed along from parent to child (inherited). Dupuytren contracture is a long-term (chronic) condition that develops (progresses) slowly over time. There is no cure, but symptoms can be managed and progression can be slowed with treatment. This condition is usually not dangerous or painful, but it can interfere with everyday tasks. What are the causes? This condition is caused by tissue (fascia) in the palm getting thicker and tighter. When the fascia thickens, it pulls on the cords of tissue (tendons) that control finger movement. This causes the fingers to contract. The cause of fascia thickening is not known. What increases the risk? This condition may be more likely to develop in:  People who are age 40 or older.  Men.  People with a family history of this condition.  People who use tobacco products, including cigarettes, chewing tobacco, and e-cigarettes.  People who drink alcohol excessively.  People with diabetes.  People with autoimmune diseases, such as HIV.  People with seizure disorders.  What are the signs or symptoms? Symptoms may develop in one or both hands. Any of the fingers can contract. The fingers farthest from the thumb are commonly affected. Usually, this condition is painless. You may have discomfort when holding or grabbing objects. Early symptoms of this condition may include:  Thick, puckered skin on the hand.  One or more lumps (nodules) on the palm. Nodules may be tender when they first appear, but they are generally painless.  Symptoms of this condition develop slowly over months or years. Later  symptoms of this condition may include:  Thick cords of tissue in the palm.  Fingers curled up toward the palm.  Inability to straighten the fingers into their normal position.  How is this diagnosed? This condition is diagnosed with a physical exam, which may include:  Looking at your hands and feeling your hands. This is to check for thickened fascia and nodules.  Measuring finger motion.  Doing the [place] Tabletop Test. You may be asked to try to put your hand on a surface, with your palm down and your fingers straight out.  How is this treated? There is no cure for this condition, but treatment can make symptoms more manageable and relieve discomfort. Treatment options may include:  Physical therapy. This can strengthen your hand and increase flexibility.  Occupational therapy. This can help you with everyday tasks that may be more difficult because of your condition.  A hand splint.  Shots (injections). Substances may be injected into your hand, such as: ? Medicines that help to decrease swelling (corticosteroids). ? Proteins (collagenase) to weaken thick tissue. After a collagenase injection, your health care provider may stretch your fingers.  Needle aponeurotomy. In this procedure, a needle is pushed through the skin and into the fascia. Moving the needle against the fascia can weaken or break up the thick tissue.  Surgery. This may be needed if your condition causes discomfort or interferes with everyday activities. Physical therapy is usually needed after surgery.  In some cases, symptoms never develop to the point of needing major treatment, and caring for yourself at home can be enough to manage your condition. Symptoms often   return after treatment. Follow these instructions at home: If you have a splint:  Do not put pressure on any part of the splint until it is fully hardened. This may take several hours.  Wear the splint as told by your health care provider.  Remove it only as told by your health care provider.  Loosen the splint if your fingers tingle, become numb, or turn cold and blue.  Do not let your splint get wet if it is not waterproof. ? If your splint is not waterproof, cover it with a watertight covering when you take a bath or a shower. ? Do not take baths, swim, or use a hot tub until your health care provider approves. Ask your health care provider if you can take showers. You may only be allowed to take sponge baths for bathing.  Keep the splint clean.  Ask your health care provider when it is safe to drive. Hand Care  Take these actions to help protect your hand from possible injury: ? Use tools that have padded grips. ? Wear protective gloves while you work with your hands. ? Avoid repetitive hand movements.  Avoid actions that cause pain or discomfort.  Stretch your hand by gently pulling your fingers backward toward your wrist. Do this as often as is comfortable. Stop if this causes pain.  Gently massage your hand as often as is comfortable.  If directed, apply heat to the affected area as often as told by your health care provider. Use the heat source that your health care provider recommends, such as a moist heat pack or a heating pad. ? Place a towel between your skin and the heat source. ? Leave the heat on for 20-30 minutes. ? Remove the heat if your skin turns bright red. This is especially important if you are unable to feel pain, heat, or cold. You may have a greater risk of getting burned. General instructions  Take over-the-counter and prescription medicines only as told by your health care provider.  Manage any other conditions that you have, such as diabetes.  If physical therapy was prescribed, do exercises as told by your health care provider.  Keep all follow-up visits as told by your health care provider. This is important. Contact a health care provider if:  You develop new symptoms, or your  symptoms get worse.  You have pain that gets worse or does not get better with medicine.  You have difficulty or discomfort with everyday tasks.  You have problems with your splint.  You develop numbness or tingling. Get help right away if:  You have severe pain.  Your fingers change color or become unusually cold. This information is not intended to replace advice given to you by your health care provider. Make sure you discuss any questions you have with your health care provider. Document Released: 10/19/2008 Document Revised: 02/05/2015 Document Reviewed: 05/16/2014 Elsevier Interactive Patient Education  2018 [employer]   Dupuytren's Contracture Surgery, Care After Refer to this sheet in the next few weeks. These instructions provide you with information about caring for yourself after your procedure. Your health care provider may also give you more specific instructions. Your treatment has been planned according to current medical practices, but problems sometimes occur. Call your health care provider if you have any problems or questions after your procedure. What can I expect after the procedure? After the procedure, it is common to have:  Pain, swelling, and stiffness in your hand, especially near your incision.  Some clear fluid or blood leaking from your incision.  Follow these instructions at home: If you have a splint:  Do not put pressure on any part of the splint until it is fully hardened. This may take several hours.  Wear the splint as told by your health care provider. Remove it only as told by your health care provider.  Loosen the splint if your fingers tingle, become numb, or turn cold and blue.  Do not let your splint get wet if it is not waterproof.  Keep the splint clean. Bathing  Do not take baths, swim, or use a hot tub until your health care provider approves. Ask your health care provider if you can take showers. You may only be allowed to  take sponge baths for bathing.  If your splint is not waterproof, cover it with a watertight covering when you take a bath or a shower.  Keep your bandage (dressing) dry until your health care provider says it can be removed. Incision care  Keep your incision area clean and dry.  Follow instructions from your health care provider about how to take care of your incision. Make sure you: ? Wash your hands with soap and water before you change your bandage (dressing). If soap and water are not available, use hand sanitizer. ? Change your dressing as told by your health care provider. ? Leave stitches (sutures), skin glue, or adhesive strips in place. These skin closures may need to stay in place for 2 weeks or longer. If adhesive strip edges start to loosen and curl up, you may trim the loose edges. Do not remove adhesive strips completely unless your health care provider tells you to do that.  Check your incision area every day for signs of infection. Check for: ? More redness, swelling, or pain. ? More fluid or blood. ? Warmth. ? Pus or a bad smell. Managing pain, stiffness, and swelling  If directed, put ice on the affected area. ? Put ice in a plastic bag. ? Place a towel between your skin and the bag. ? Leave the ice on for 20 minutes, 2-3 times a day.  Move your fingers often to avoid stiffness and to lessen swelling.  Raise (elevate) your hand above the level of your heart while you are sitting or lying down. Driving  Do not drive or operate heavy machinery while taking prescription pain medicine.  Do not drive for 24 hours if you received a sedative.  Ask your health care provider when it is safe to drive if you have a splint on your hand. Activity  Return to your normal activities as told by your health care provider. Ask your health care provider what activities are safe for you.  Rest and protect your hand until it feels better.  Stretch and massage your hand as told  by your health care provider.  If physical therapy was prescribed, do exercises as told by your health care provider. General instructions  Take over-the-counter and prescription medicines only as told by your health care provider.  If you were prescribed an antibiotic medicine, take it as told by your health care provider. Do not stop taking the antibiotic even if you start to feel better.  Wear compression stockings as told by your health care provider. These stockings help to prevent blood clots and reduce swelling in your legs.  Keep all follow-up visits as told by your health care provider. This is important. Contact a health care provider if:  You develop new symptoms, or your symptoms get worse.  You have pain that gets worse or does not get better with medicine.  You develop numbness or tingling in your hand.  You have more redness, swelling, or pain around your incision.  You have more fluid or blood coming from your incision.  Your incision feels warm to the touch.  You have pus or a bad smell coming from your incision.  You have a fever.  You have problems with your splint. Get help right away if:  You have severe pain.  Your fingers change color or become unusually cold. This information is not intended to replace advice given to you by your health care provider. Make sure you discuss any questions you have with your health care provider. Document Released: 10/19/2008 Document Revised: 02/05/2015 Document Reviewed: 09/04/2014 Elsevier Interactive Patient Education  2018 [employer].

## 2021-01-27 DIAGNOSIS — L409 Psoriasis, unspecified: Secondary | ICD-10-CM | POA: Diagnosis not present

## 2021-07-07 DIAGNOSIS — Z0000 Encounter for general adult medical examination without abnormal findings: Secondary | ICD-10-CM | POA: Diagnosis not present

## 2021-07-07 DIAGNOSIS — Z1331 Encounter for screening for depression: Secondary | ICD-10-CM | POA: Diagnosis not present

## 2021-07-07 DIAGNOSIS — E7800 Pure hypercholesterolemia, unspecified: Secondary | ICD-10-CM | POA: Diagnosis not present

## 2021-07-07 DIAGNOSIS — M720 Palmar fascial fibromatosis [Dupuytren]: Secondary | ICD-10-CM | POA: Diagnosis not present

## 2021-07-07 DIAGNOSIS — Z125 Encounter for screening for malignant neoplasm of prostate: Secondary | ICD-10-CM | POA: Diagnosis not present

## 2021-07-07 DIAGNOSIS — R7303 Prediabetes: Secondary | ICD-10-CM | POA: Diagnosis not present

## 2021-07-07 DIAGNOSIS — I10 Essential (primary) hypertension: Secondary | ICD-10-CM | POA: Diagnosis not present

## 2021-07-28 DIAGNOSIS — Z79899 Other long term (current) drug therapy: Secondary | ICD-10-CM | POA: Diagnosis not present

## 2021-07-28 DIAGNOSIS — L578 Other skin changes due to chronic exposure to nonionizing radiation: Secondary | ICD-10-CM | POA: Diagnosis not present

## 2021-07-28 DIAGNOSIS — L409 Psoriasis, unspecified: Secondary | ICD-10-CM | POA: Diagnosis not present

## 2021-07-28 DIAGNOSIS — L821 Other seborrheic keratosis: Secondary | ICD-10-CM | POA: Diagnosis not present

## 2021-07-28 DIAGNOSIS — L814 Other melanin hyperpigmentation: Secondary | ICD-10-CM | POA: Diagnosis not present

## 2021-07-31 DIAGNOSIS — M25561 Pain in right knee: Secondary | ICD-10-CM | POA: Diagnosis not present

## 2021-07-31 DIAGNOSIS — M25562 Pain in left knee: Secondary | ICD-10-CM | POA: Diagnosis not present

## 2021-09-12 DIAGNOSIS — U071 COVID-19: Secondary | ICD-10-CM | POA: Diagnosis not present

## 2021-09-12 DIAGNOSIS — Z1152 Encounter for screening for COVID-19: Secondary | ICD-10-CM | POA: Diagnosis not present

## 2021-09-12 DIAGNOSIS — R509 Fever, unspecified: Secondary | ICD-10-CM | POA: Diagnosis not present

## 2022-02-02 DIAGNOSIS — L409 Psoriasis, unspecified: Secondary | ICD-10-CM | POA: Diagnosis not present

## 2022-02-02 DIAGNOSIS — Z79899 Other long term (current) drug therapy: Secondary | ICD-10-CM | POA: Diagnosis not present

## 2022-08-03 DIAGNOSIS — L821 Other seborrheic keratosis: Secondary | ICD-10-CM | POA: Diagnosis not present

## 2022-08-03 DIAGNOSIS — L409 Psoriasis, unspecified: Secondary | ICD-10-CM | POA: Diagnosis not present

## 2022-08-03 DIAGNOSIS — L578 Other skin changes due to chronic exposure to nonionizing radiation: Secondary | ICD-10-CM | POA: Diagnosis not present

## 2022-08-03 DIAGNOSIS — Z79899 Other long term (current) drug therapy: Secondary | ICD-10-CM | POA: Diagnosis not present

## 2022-08-03 DIAGNOSIS — L814 Other melanin hyperpigmentation: Secondary | ICD-10-CM | POA: Diagnosis not present

## 2022-09-04 DIAGNOSIS — I10 Essential (primary) hypertension: Secondary | ICD-10-CM | POA: Diagnosis not present

## 2022-09-04 DIAGNOSIS — Z0000 Encounter for general adult medical examination without abnormal findings: Secondary | ICD-10-CM | POA: Diagnosis not present

## 2022-09-04 DIAGNOSIS — N400 Enlarged prostate without lower urinary tract symptoms: Secondary | ICD-10-CM | POA: Diagnosis not present

## 2022-09-04 DIAGNOSIS — R7303 Prediabetes: Secondary | ICD-10-CM | POA: Diagnosis not present

## 2022-09-04 DIAGNOSIS — E559 Vitamin D deficiency, unspecified: Secondary | ICD-10-CM | POA: Diagnosis not present

## 2022-09-04 DIAGNOSIS — E7800 Pure hypercholesterolemia, unspecified: Secondary | ICD-10-CM | POA: Diagnosis not present

## 2022-09-04 DIAGNOSIS — Z125 Encounter for screening for malignant neoplasm of prostate: Secondary | ICD-10-CM | POA: Diagnosis not present
